# Patient Record
Sex: Female | Born: 1935 | Race: White | Hispanic: No
Health system: Southern US, Community
[De-identification: ages and names within clinical notes are randomized; demographics above are authoritative.]

---

## 2007-08-19 ENCOUNTER — Inpatient Hospital Stay: Payer: Self-pay | Admitting: Internal Medicine

## 2007-08-19 ENCOUNTER — Other Ambulatory Visit: Payer: Self-pay

## 2007-09-04 ENCOUNTER — Ambulatory Visit: Payer: Self-pay | Admitting: Gastroenterology

## 2008-12-01 ENCOUNTER — Emergency Department: Payer: Self-pay | Admitting: Emergency Medicine

## 2009-05-07 ENCOUNTER — Emergency Department: Payer: Self-pay | Admitting: Emergency Medicine

## 2010-01-11 ENCOUNTER — Emergency Department: Payer: Self-pay | Admitting: Emergency Medicine

## 2010-11-09 ENCOUNTER — Ambulatory Visit: Payer: Self-pay

## 2010-11-20 ENCOUNTER — Ambulatory Visit: Payer: Self-pay | Admitting: Emergency Medicine

## 2011-02-08 ENCOUNTER — Inpatient Hospital Stay: Payer: Self-pay | Admitting: Internal Medicine

## 2011-04-05 ENCOUNTER — Ambulatory Visit: Payer: Self-pay | Admitting: Nephrology

## 2011-04-25 ENCOUNTER — Emergency Department: Payer: Self-pay

## 2011-08-27 ENCOUNTER — Emergency Department: Payer: Self-pay | Admitting: Emergency Medicine

## 2012-02-07 ENCOUNTER — Ambulatory Visit: Payer: Self-pay | Admitting: Family Medicine

## 2013-01-09 ENCOUNTER — Other Ambulatory Visit: Payer: Self-pay

## 2013-01-12 ENCOUNTER — Ambulatory Visit: Payer: Self-pay | Admitting: Family Medicine

## 2013-05-19 ENCOUNTER — Emergency Department: Payer: Self-pay | Admitting: Emergency Medicine

## 2013-05-19 LAB — URINALYSIS, COMPLETE
Bilirubin,UR: NEGATIVE
Blood: NEGATIVE
Glucose,UR: NEGATIVE mg/dL (ref 0–75)
Ketone: NEGATIVE
Nitrite: NEGATIVE
Ph: 5 (ref 4.5–8.0)
Protein: NEGATIVE
Specific Gravity: 1.025 (ref 1.003–1.030)
Squamous Epithelial: 1
WBC UR: 1 /HPF (ref 0–5)

## 2013-05-19 LAB — DRUG SCREEN, URINE
Cannabinoid 50 Ng, Ur ~~LOC~~: NEGATIVE (ref ?–50)
Cocaine Metabolite,Ur ~~LOC~~: NEGATIVE (ref ?–300)
MDMA (Ecstasy)Ur Screen: NEGATIVE (ref ?–500)
Methadone, Ur Screen: NEGATIVE (ref ?–300)
Opiate, Ur Screen: NEGATIVE (ref ?–300)
Tricyclic, Ur Screen: NEGATIVE (ref ?–1000)

## 2013-05-19 LAB — COMPREHENSIVE METABOLIC PANEL
Albumin: 3 g/dL — ABNORMAL LOW (ref 3.4–5.0)
Alkaline Phosphatase: 124 U/L (ref 50–136)
Anion Gap: 5 — ABNORMAL LOW (ref 7–16)
BUN: 26 mg/dL — ABNORMAL HIGH (ref 7–18)
Calcium, Total: 8.8 mg/dL (ref 8.5–10.1)
Co2: 27 mmol/L (ref 21–32)
Creatinine: 0.84 mg/dL (ref 0.60–1.30)
EGFR (African American): >60
EGFR (Non-African Amer.): 60 — ABNORMAL LOW
SGPT (ALT): 17 U/L (ref 12–78)

## 2013-05-19 LAB — CBC
HGB: 10.5 g/dL — ABNORMAL LOW (ref 12.0–16.0)
MCH: 29.2 pg (ref 26.0–34.0)
Platelet: 212 10*3/uL (ref 150–440)
RBC: 3.6 10*6/uL — ABNORMAL LOW (ref 3.80–5.20)
RDW: 14.5 % (ref 11.5–14.5)
WBC: 7.8 10*3/uL (ref 3.6–11.0)

## 2013-05-19 LAB — ETHANOL
Ethanol %: <0.003 % (ref 0.000–0.080)
Ethanol: <3 mg/dL

## 2013-05-19 LAB — TSH: Thyroid Stimulating Horm: 3.33 u[IU]/mL

## 2013-05-23 ENCOUNTER — Other Ambulatory Visit: Payer: Self-pay | Admitting: Family Medicine

## 2013-05-23 LAB — URINALYSIS, COMPLETE
Bacteria: NONE SEEN
Bilirubin,UR: NEGATIVE
Blood: NEGATIVE
Glucose,UR: NEGATIVE mg/dL (ref 0–75)
Leukocyte Esterase: NEGATIVE
Nitrite: NEGATIVE
Protein: NEGATIVE
RBC,UR: 4 /HPF (ref 0–5)
Squamous Epithelial: 1
WBC UR: 1 /HPF (ref 0–5)

## 2013-06-05 ENCOUNTER — Other Ambulatory Visit: Payer: Self-pay | Admitting: Family Medicine

## 2013-08-18 ENCOUNTER — Inpatient Hospital Stay: Payer: Self-pay | Admitting: Internal Medicine

## 2013-08-18 LAB — COMPREHENSIVE METABOLIC PANEL
Albumin: 3.3 g/dL — ABNORMAL LOW (ref 3.4–5.0)
Alkaline Phosphatase: 106 U/L (ref 50–136)
BUN: 27 mg/dL — ABNORMAL HIGH (ref 7–18)
Bilirubin,Total: 0.4 mg/dL (ref 0.2–1.0)
Calcium, Total: 9.3 mg/dL (ref 8.5–10.1)
Co2: 24 mmol/L (ref 21–32)
Creatinine: 1.03 mg/dL (ref 0.60–1.30)
EGFR (African American): 54 — ABNORMAL LOW
EGFR (Non-African Amer.): 47 — ABNORMAL LOW
Glucose: 114 mg/dL — ABNORMAL HIGH (ref 65–99)
Osmolality: 287 (ref 275–301)
Potassium: 3.7 mmol/L (ref 3.5–5.1)
SGOT(AST): 23 U/L (ref 15–37)
Sodium: 141 mmol/L (ref 136–145)
Total Protein: 7.4 g/dL (ref 6.4–8.2)

## 2013-08-18 LAB — URINALYSIS, COMPLETE
Bilirubin,UR: NEGATIVE
Blood: NEGATIVE
Glucose,UR: NEGATIVE mg/dL (ref 0–75)
Ketone: NEGATIVE
Ph: 5 (ref 4.5–8.0)
RBC,UR: 3 /HPF (ref 0–5)
Specific Gravity: 1.03 (ref 1.003–1.030)
Squamous Epithelial: 1

## 2013-08-18 LAB — CBC
HCT: 35.9 % (ref 35.0–47.0)
MCH: 28.1 pg (ref 26.0–34.0)
MCHC: 33.2 g/dL (ref 32.0–36.0)
Platelet: 255 10*3/uL (ref 150–440)
WBC: 6.3 10*3/uL (ref 3.6–11.0)

## 2013-08-18 LAB — PRO B NATRIURETIC PEPTIDE: B-Type Natriuretic Peptide: 5979 pg/mL — ABNORMAL HIGH (ref 0–450)

## 2013-08-19 LAB — BASIC METABOLIC PANEL
Anion Gap: 6 — ABNORMAL LOW (ref 7–16)
Chloride: 110 mmol/L — ABNORMAL HIGH (ref 98–107)
EGFR (Non-African Amer.): 51 — ABNORMAL LOW
Osmolality: 284 (ref 275–301)
Sodium: 141 mmol/L (ref 136–145)

## 2013-08-19 LAB — CBC WITH DIFFERENTIAL/PLATELET
Eosinophil #: 0.1 10*3/uL (ref 0.0–0.7)
HCT: 32 % — ABNORMAL LOW (ref 35.0–47.0)
HGB: 11 g/dL — ABNORMAL LOW (ref 12.0–16.0)
Lymphocyte %: 20.5 %
MCH: 28.5 pg (ref 26.0–34.0)
MCHC: 34.3 g/dL (ref 32.0–36.0)
Monocyte #: 0.5 x10 3/mm (ref 0.2–0.9)
Monocyte %: 9.5 %
Neutrophil #: 3.8 10*3/uL (ref 1.4–6.5)
Neutrophil %: 66.8 %
RBC: 3.85 10*6/uL (ref 3.80–5.20)
WBC: 5.6 10*3/uL (ref 3.6–11.0)

## 2013-08-20 LAB — URINE CULTURE

## 2013-08-21 LAB — BASIC METABOLIC PANEL
BUN: 32 mg/dL — ABNORMAL HIGH (ref 7–18)
Calcium, Total: 9.2 mg/dL (ref 8.5–10.1)
Chloride: 107 mmol/L (ref 98–107)
Co2: 27 mmol/L (ref 21–32)
Creatinine: 1.54 mg/dL — ABNORMAL HIGH (ref 0.60–1.30)
EGFR (African American): 33 — ABNORMAL LOW
Osmolality: 288 (ref 275–301)
Potassium: 3.1 mmol/L — ABNORMAL LOW (ref 3.5–5.1)
Sodium: 141 mmol/L (ref 136–145)

## 2013-08-23 LAB — CULTURE, BLOOD (SINGLE)

## 2013-09-02 ENCOUNTER — Emergency Department: Payer: Self-pay | Admitting: Emergency Medicine

## 2013-09-02 LAB — URINALYSIS, COMPLETE
Bilirubin,UR: NEGATIVE
Blood: NEGATIVE
Hyaline Cast: 3
Ketone: NEGATIVE
Nitrite: NEGATIVE
Ph: 5 (ref 4.5–8.0)
RBC,UR: 6 /HPF (ref 0–5)
Squamous Epithelial: 1

## 2013-09-02 LAB — COMPREHENSIVE METABOLIC PANEL
Alkaline Phosphatase: 91 U/L (ref 50–136)
BUN: 25 mg/dL — ABNORMAL HIGH (ref 7–18)
Chloride: 108 mmol/L — ABNORMAL HIGH (ref 98–107)
Creatinine: 1.19 mg/dL (ref 0.60–1.30)
EGFR (African American): 46 — ABNORMAL LOW
Osmolality: 282 (ref 275–301)
Potassium: 3.8 mmol/L (ref 3.5–5.1)
SGPT (ALT): 22 U/L (ref 12–78)
Sodium: 139 mmol/L (ref 136–145)
Total Protein: 7 g/dL (ref 6.4–8.2)

## 2013-09-02 LAB — CK TOTAL AND CKMB (NOT AT ARMC): CK, Total: 189 U/L (ref 21–215)

## 2013-09-02 LAB — CBC
HCT: 35.9 % (ref 35.0–47.0)
MCHC: 33.4 g/dL (ref 32.0–36.0)
Platelet: 192 10*3/uL (ref 150–440)
RBC: 4.31 10*6/uL (ref 3.80–5.20)
RDW: 15.3 % — ABNORMAL HIGH (ref 11.5–14.5)

## 2013-10-29 ENCOUNTER — Ambulatory Visit: Payer: Self-pay | Admitting: Internal Medicine

## 2013-11-19 ENCOUNTER — Other Ambulatory Visit: Payer: Self-pay | Admitting: Family Medicine

## 2013-11-19 LAB — CBC WITH DIFFERENTIAL/PLATELET
BASOS ABS: 0 10*3/uL (ref 0.0–0.1)
Basophil %: 0.5 %
EOS ABS: 0 10*3/uL (ref 0.0–0.7)
EOS PCT: 0.1 %
HCT: 31.4 % — ABNORMAL LOW (ref 35.0–47.0)
HGB: 10.4 g/dL — AB (ref 12.0–16.0)
LYMPHS PCT: 9.1 %
Lymphocyte #: 0.7 10*3/uL — ABNORMAL LOW (ref 1.0–3.6)
MCH: 29.4 pg (ref 26.0–34.0)
MCHC: 33.1 g/dL (ref 32.0–36.0)
MCV: 89 fL (ref 80–100)
MONOS PCT: 10.1 %
Monocyte #: 0.8 x10 3/mm (ref 0.2–0.9)
NEUTROS PCT: 80.2 %
Neutrophil #: 6.2 10*3/uL (ref 1.4–6.5)
Platelet: 180 10*3/uL (ref 150–440)
RBC: 3.53 10*6/uL — AB (ref 3.80–5.20)
RDW: 20 % — AB (ref 11.5–14.5)
WBC: 7.7 10*3/uL (ref 3.6–11.0)

## 2013-11-19 LAB — BASIC METABOLIC PANEL
ANION GAP: 9 (ref 7–16)
BUN: 43 mg/dL — ABNORMAL HIGH (ref 7–18)
Calcium, Total: 9 mg/dL (ref 8.5–10.1)
Chloride: 110 mmol/L — ABNORMAL HIGH (ref 98–107)
Co2: 24 mmol/L (ref 21–32)
Creatinine: 1.25 mg/dL (ref 0.60–1.30)
EGFR (African American): 43 — ABNORMAL LOW
EGFR (Non-African Amer.): 37 — ABNORMAL LOW
GLUCOSE: 107 mg/dL — AB (ref 65–99)
OSMOLALITY: 296 (ref 275–301)
POTASSIUM: 5.3 mmol/L — AB (ref 3.5–5.1)
SODIUM: 143 mmol/L (ref 136–145)

## 2013-11-23 ENCOUNTER — Emergency Department: Payer: Self-pay | Admitting: Emergency Medicine

## 2013-11-23 LAB — CBC WITH DIFFERENTIAL/PLATELET
BASOS ABS: 0 10*3/uL (ref 0.0–0.1)
Basophil %: 0.6 %
EOS PCT: 0.1 %
Eosinophil #: 0 10*3/uL (ref 0.0–0.7)
HCT: 33.4 % — ABNORMAL LOW (ref 35.0–47.0)
HGB: 10.6 g/dL — ABNORMAL LOW (ref 12.0–16.0)
Lymphocyte #: 0.5 10*3/uL — ABNORMAL LOW (ref 1.0–3.6)
Lymphocyte %: 7.4 %
MCH: 28.9 pg (ref 26.0–34.0)
MCHC: 31.9 g/dL — ABNORMAL LOW (ref 32.0–36.0)
MCV: 91 fL (ref 80–100)
MONO ABS: 0.3 x10 3/mm (ref 0.2–0.9)
Monocyte %: 4.3 %
NEUTROS ABS: 6 10*3/uL (ref 1.4–6.5)
Neutrophil %: 87.6 %
Platelet: 222 10*3/uL (ref 150–440)
RBC: 3.68 10*6/uL — AB (ref 3.80–5.20)
RDW: 18.8 % — AB (ref 11.5–14.5)
WBC: 6.9 10*3/uL (ref 3.6–11.0)

## 2013-11-23 LAB — COMPREHENSIVE METABOLIC PANEL
ALK PHOS: 63 U/L
ANION GAP: 5 — AB (ref 7–16)
Albumin: 2.9 g/dL — ABNORMAL LOW (ref 3.4–5.0)
BUN: 61 mg/dL — AB (ref 7–18)
Bilirubin,Total: 0.5 mg/dL (ref 0.2–1.0)
CO2: 25 mmol/L (ref 21–32)
Calcium, Total: 8.9 mg/dL (ref 8.5–10.1)
Chloride: 112 mmol/L — ABNORMAL HIGH (ref 98–107)
Creatinine: 1.51 mg/dL — ABNORMAL HIGH (ref 0.60–1.30)
EGFR (African American): 34 — ABNORMAL LOW
EGFR (Non-African Amer.): 29 — ABNORMAL LOW
Glucose: 126 mg/dL — ABNORMAL HIGH (ref 65–99)
OSMOLALITY: 302 (ref 275–301)
POTASSIUM: 4.4 mmol/L (ref 3.5–5.1)
SGOT(AST): 34 U/L (ref 15–37)
SGPT (ALT): 20 U/L (ref 12–78)
SODIUM: 142 mmol/L (ref 136–145)
TOTAL PROTEIN: 7.4 g/dL (ref 6.4–8.2)

## 2013-11-23 LAB — PROTIME-INR
INR: 1
Prothrombin Time: 13.4 secs (ref 11.5–14.7)

## 2013-11-23 LAB — TROPONIN I: Troponin-I: 1.8 ng/mL — ABNORMAL HIGH

## 2013-11-23 LAB — PRO B NATRIURETIC PEPTIDE: B-TYPE NATIURETIC PEPTID: 54436 pg/mL — AB (ref 0–450)

## 2013-11-23 LAB — MAGNESIUM: Magnesium: 2.3 mg/dL

## 2013-11-23 LAB — PHOSPHORUS: Phosphorus: 4.3 mg/dL (ref 2.5–4.9)

## 2013-11-23 LAB — CK-MB: CK-MB: 2.9 ng/mL

## 2013-11-28 LAB — CULTURE, BLOOD (SINGLE)

## 2013-11-29 ENCOUNTER — Ambulatory Visit: Payer: Self-pay | Admitting: Internal Medicine

## 2013-11-29 DEATH — deceased

## 2014-06-18 IMAGING — CR RIGHT HIP - COMPLETE 2+ VIEW
1 series · 3 of 3 positions shown · non-contrast
Comparison: CT pelvis August 18, 2013 ; right hip radiographs
August 18, 2013

CLINICAL DATA: Pain post trauma

EXAM:
RIGHT HIP - COMPLETE 2+ VIEW

[Series 8: t pelvis ap · 0.14mm/px · 3 of 3 slices shown]
[im 1/3]
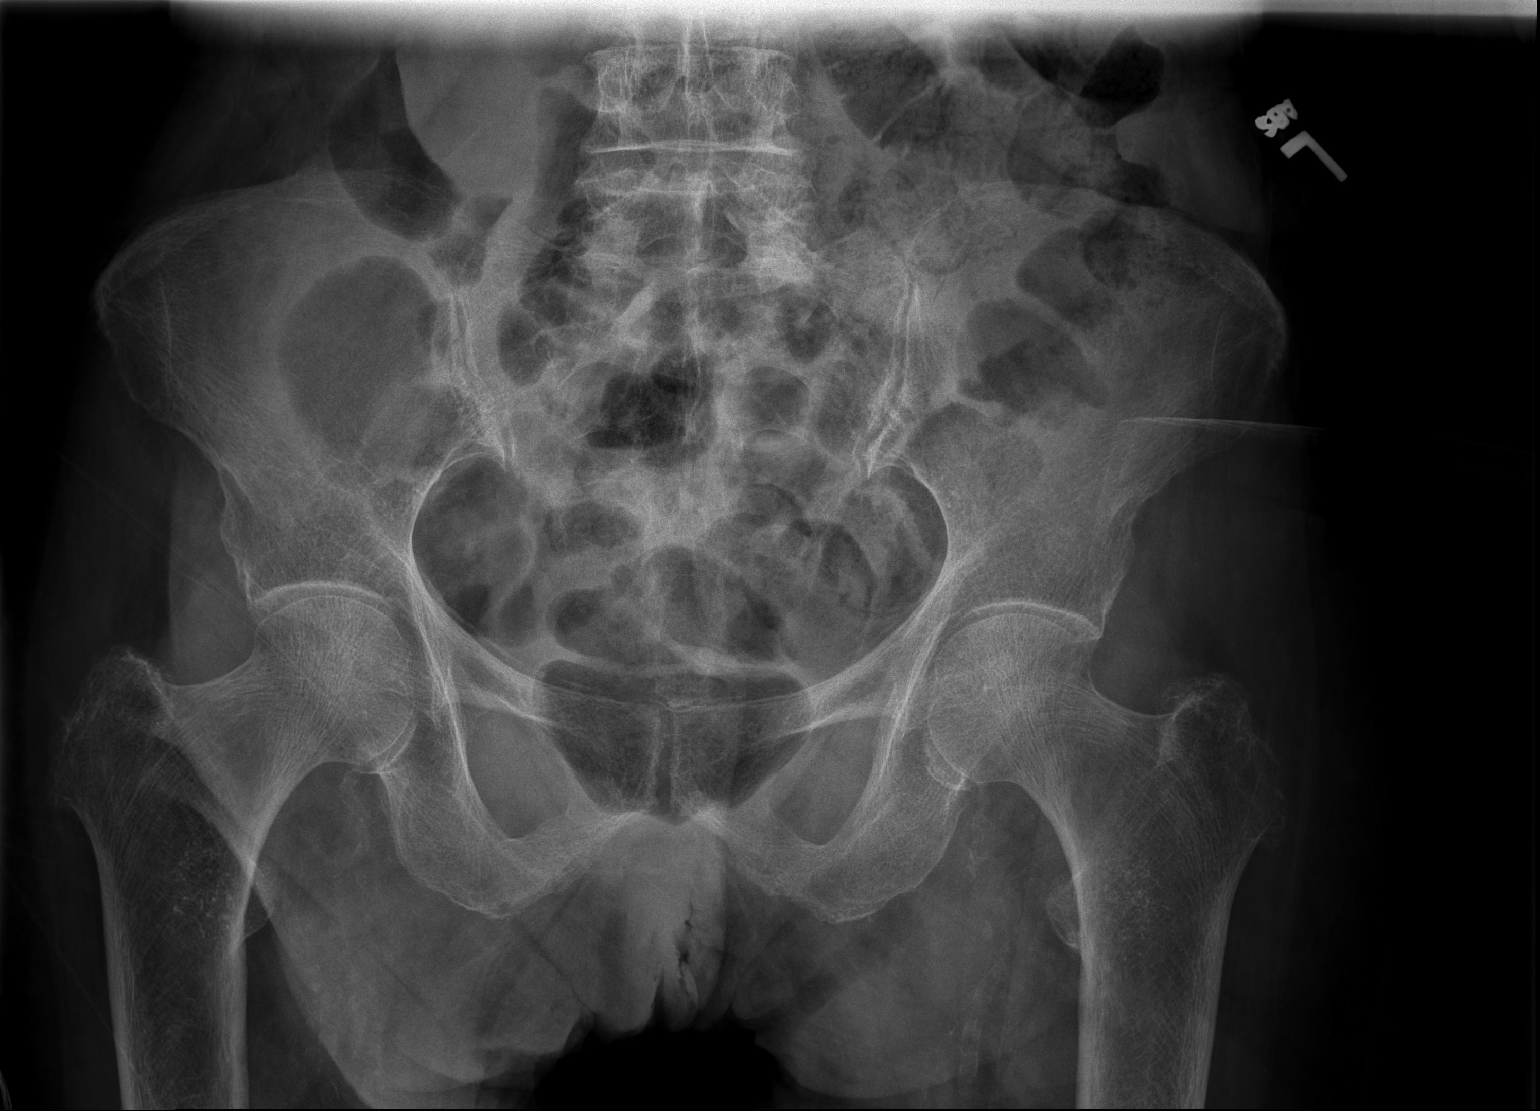
[im 2/3]
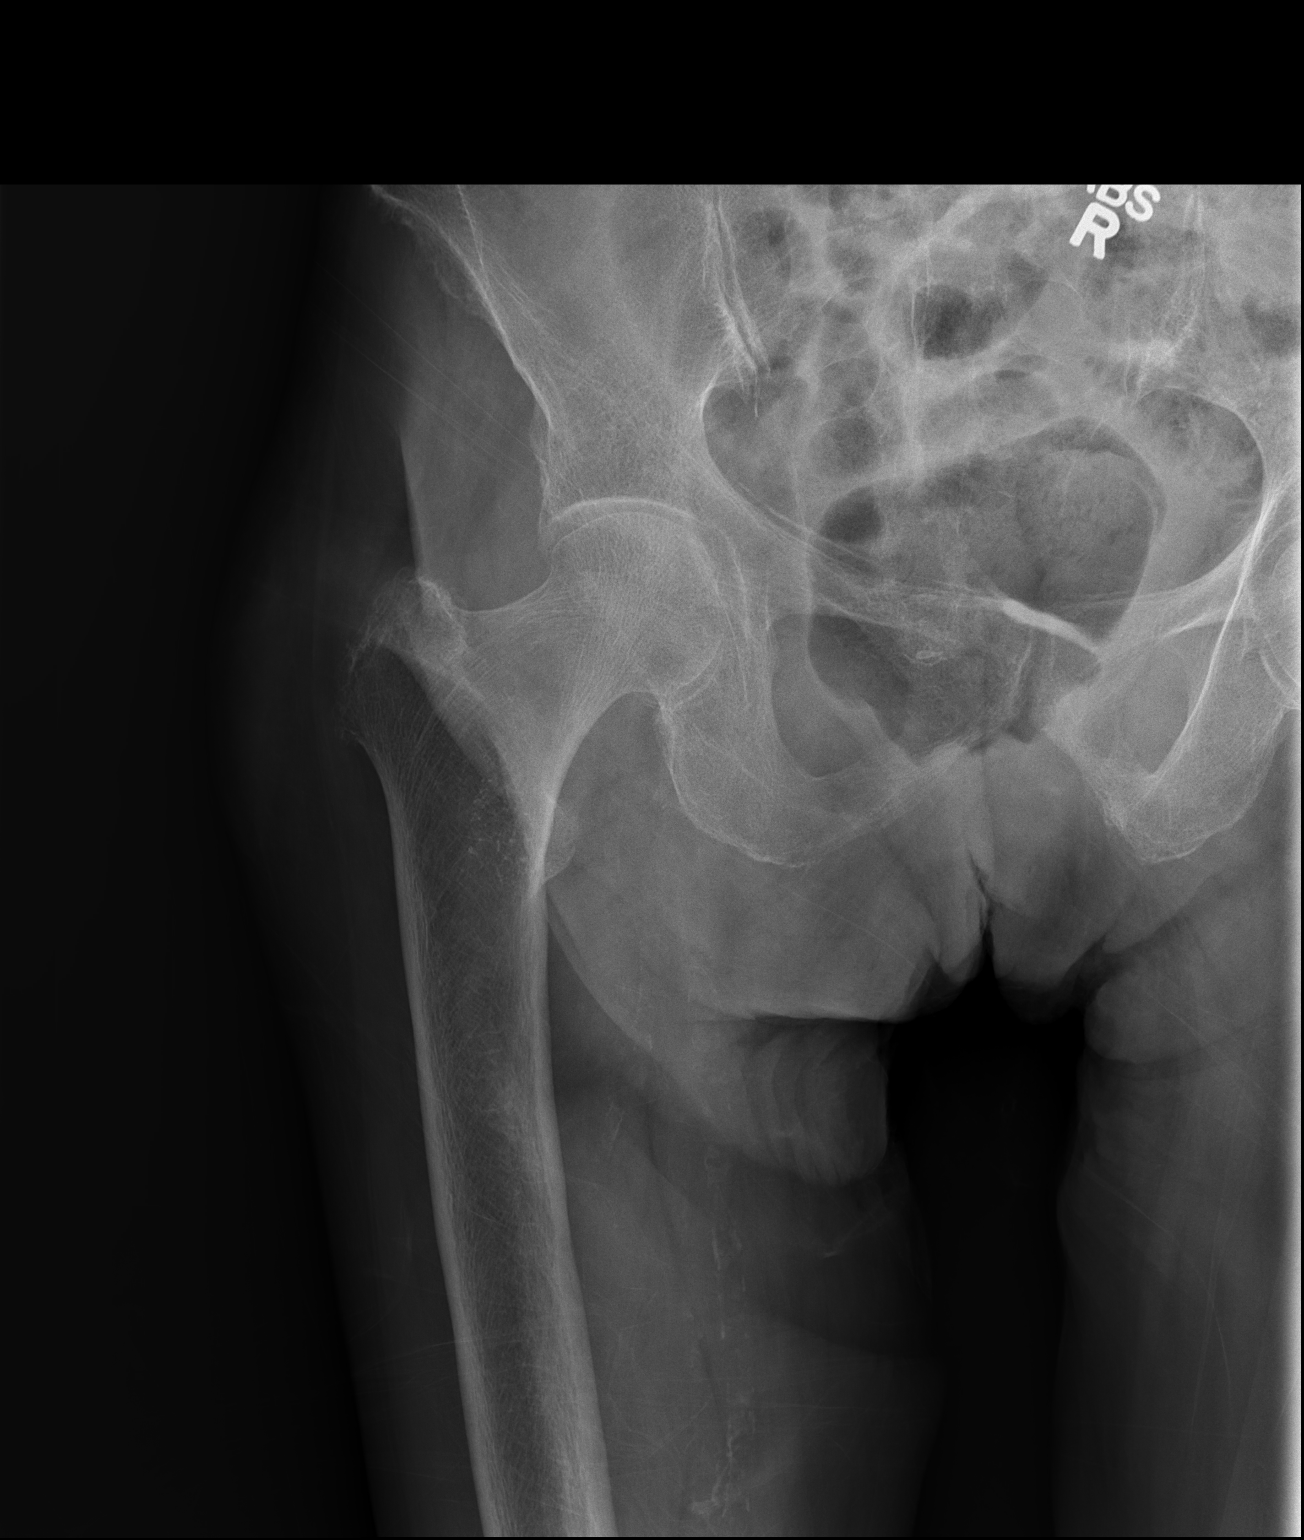
[im 3/3]
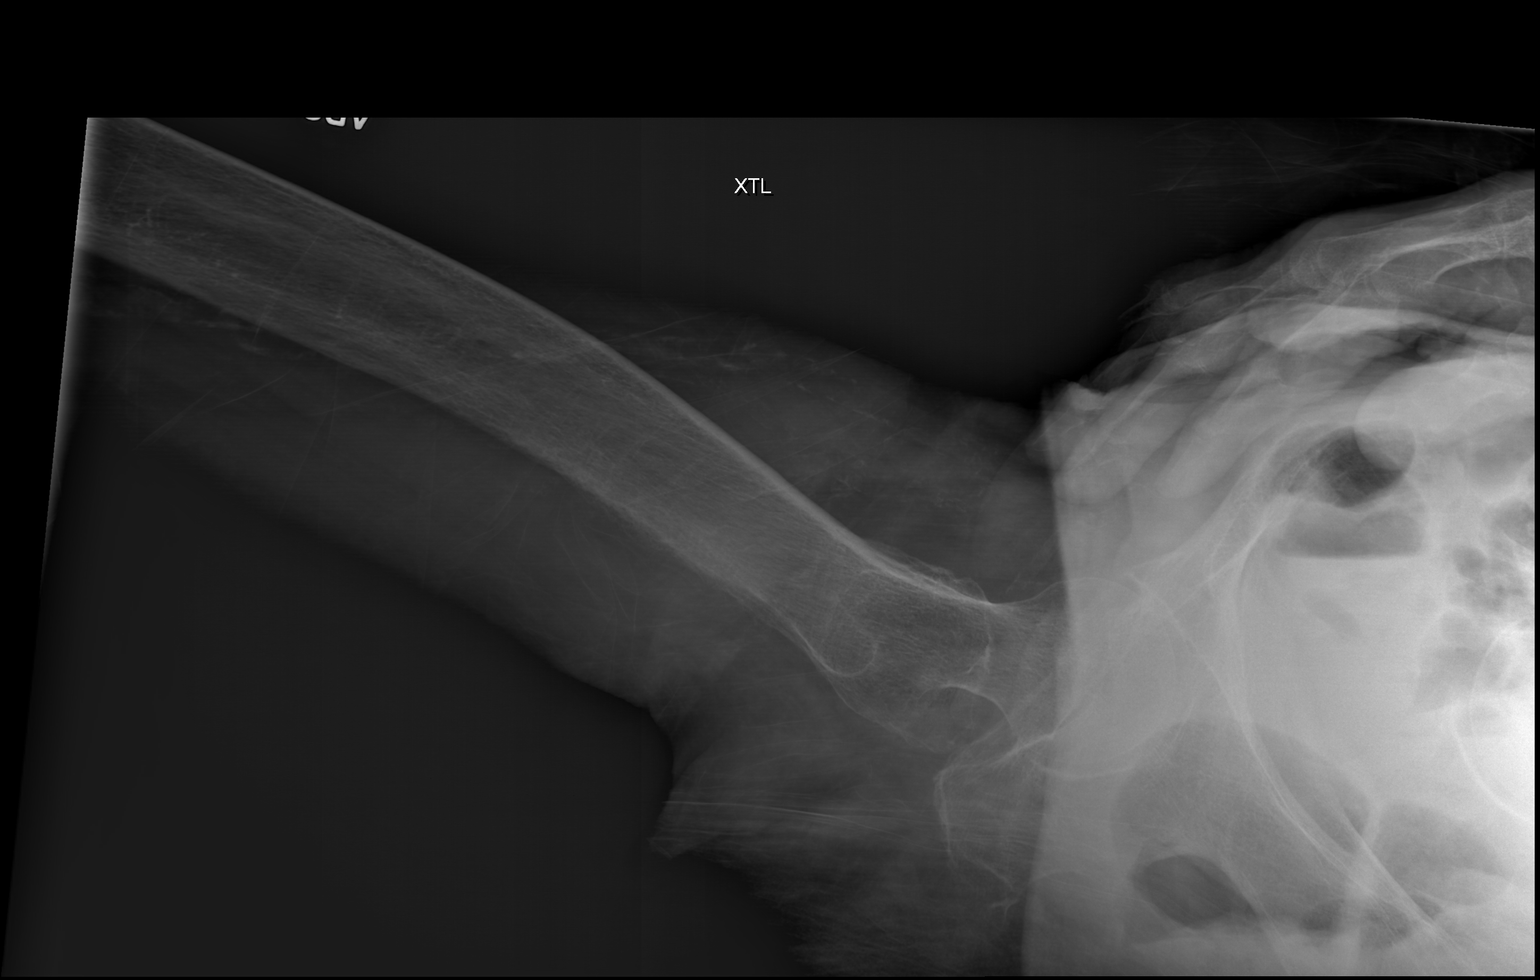

[3 of 3 positions shown; findings below may reference images not displayed]

FINDINGS: Frontal pelvis as well as frontal and lateral right hip images were
obtained. There is no fracture or dislocation apparent. There is
mild symmetric narrowing of both hip joints. No erosive change.
Bones are osteoporotic.
IMPRESSION: Mild symmetric narrowing of both hip joints. No demonstrable
fracture or dislocation. Bones osteoporotic.

## 2014-06-18 IMAGING — CT CT HEAD WITHOUT CONTRAST
3 series · 17 of 30 positions shown, 18 images · non-contrast
Comparison: CT 05/19/2013.

CLINICAL DATA: Recent fall with head injury. Decreased level of
consciousness.

EXAM:
CT HEAD WITHOUT CONTRAST
TECHNIQUE: Contiguous axial images were obtained from the base of the skull
through the vertex without intravenous contrast.

[Series 5: head wo · axial · 0.45mm/px · z∈[-92,-7]mm · 4 of 29 slices shown, 5 images]
[im 6/29  brain]
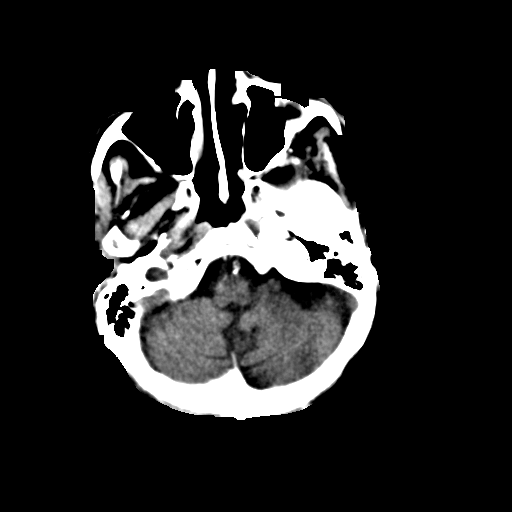
[im 6/29  bone]
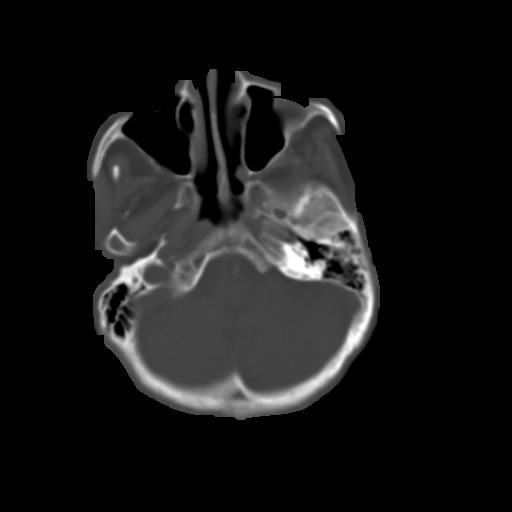
[im 12/29  brain]
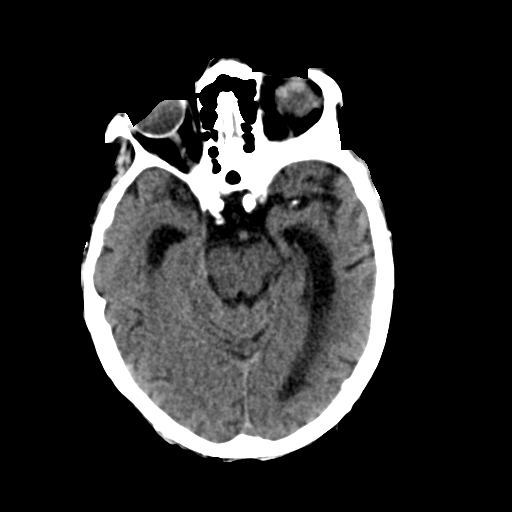
[im 17/29  brain]
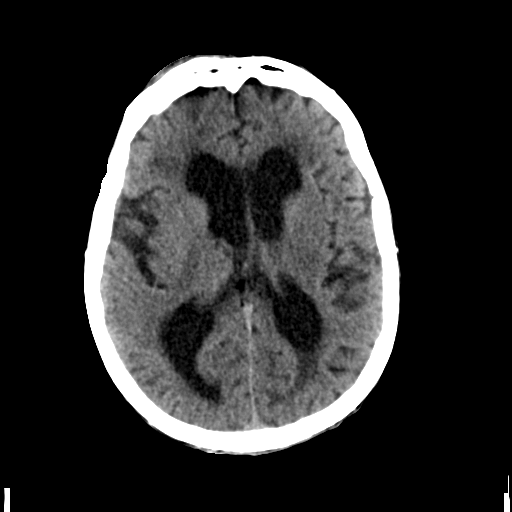
[im 23/29  brain]
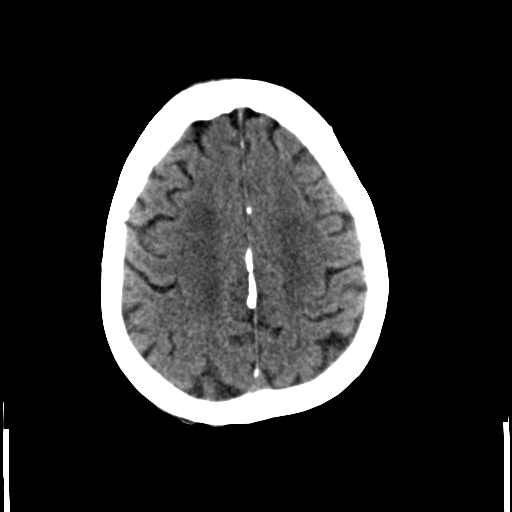

[Series 6: head bone · axial · 0.45mm/px · z∈[-109,+17]mm · 8 of 73 slices shown]
[im 5/73  bone]
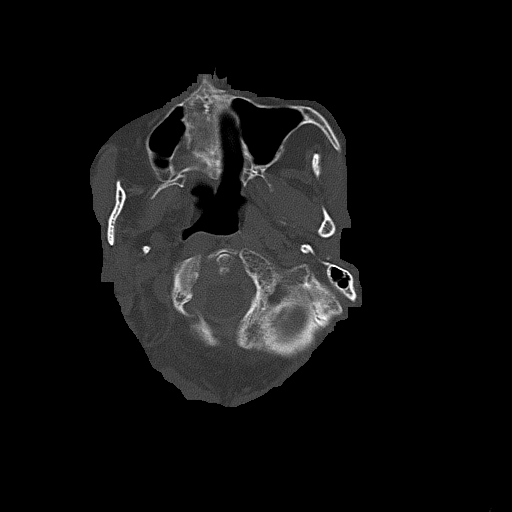
[im 14/73  bone]
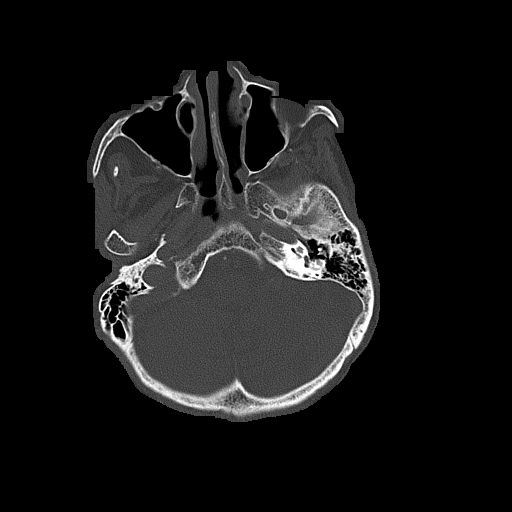
[im 23/73  bone]
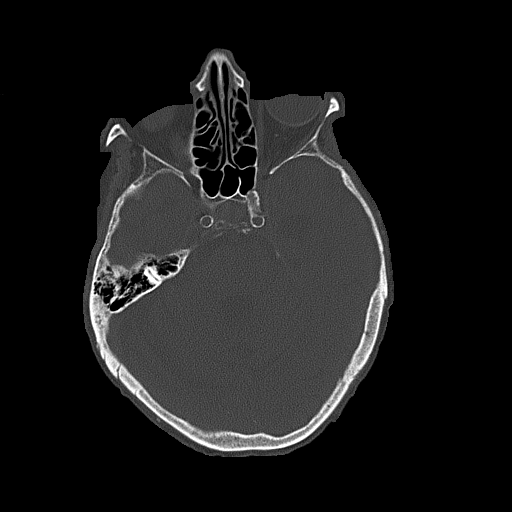
[im 32/73  bone]
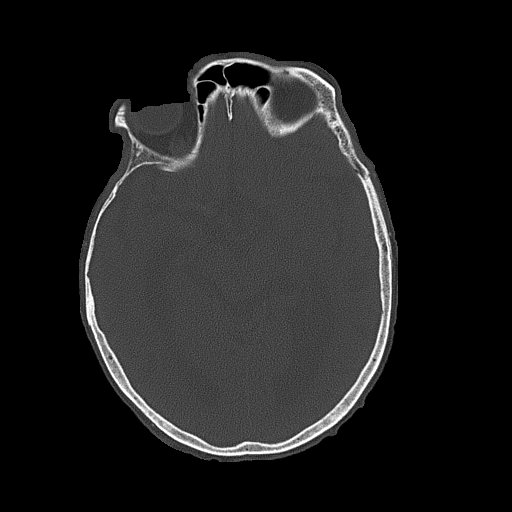
[im 41/73  bone]
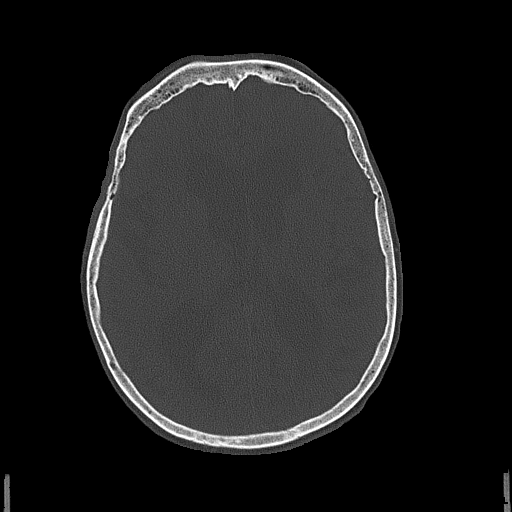
[im 50/73  bone]
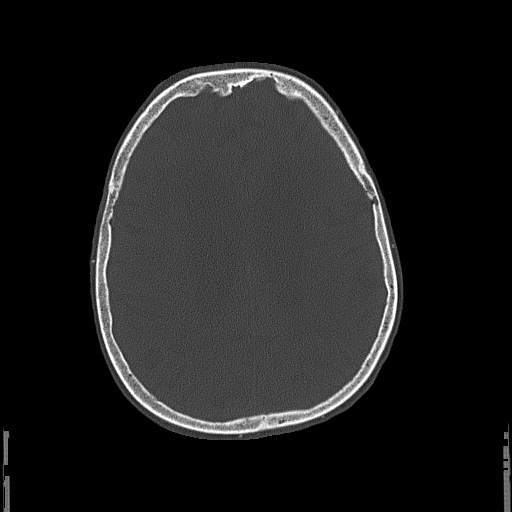
[im 59/73  bone]
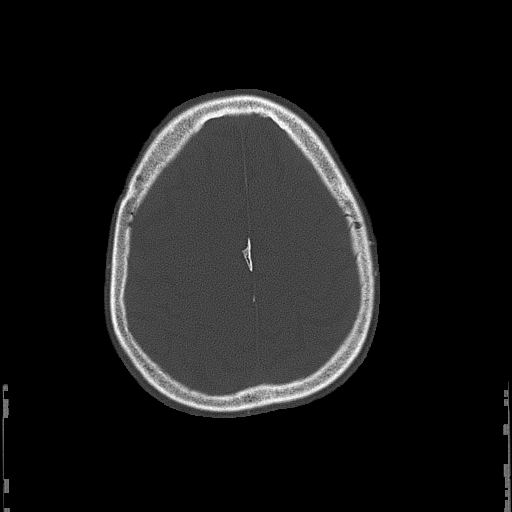
[im 68/73  bone]
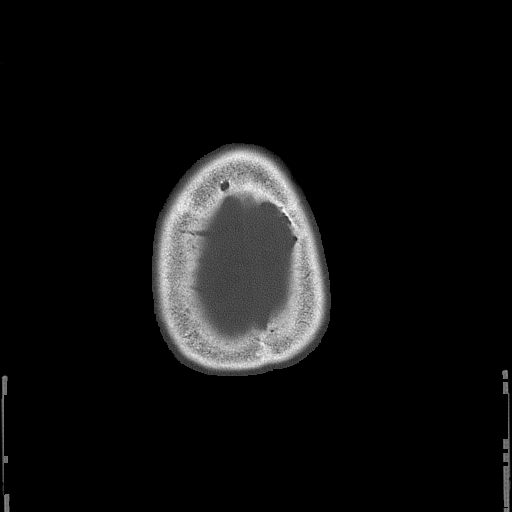

[Series 7: head wo2 · axial · 0.45mm/px · z∈[-86,+9]mm · 5 of 29 slices shown]
[im 5/29  brain]
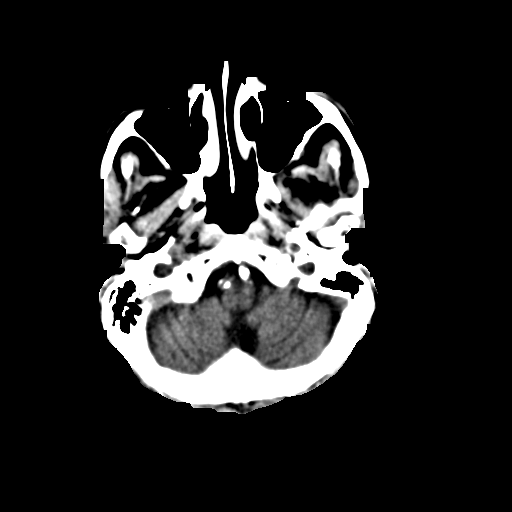
[im 10/29  brain]
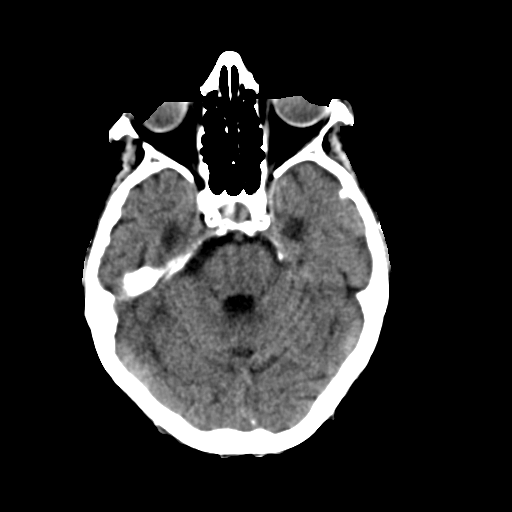
[im 15/29  brain]
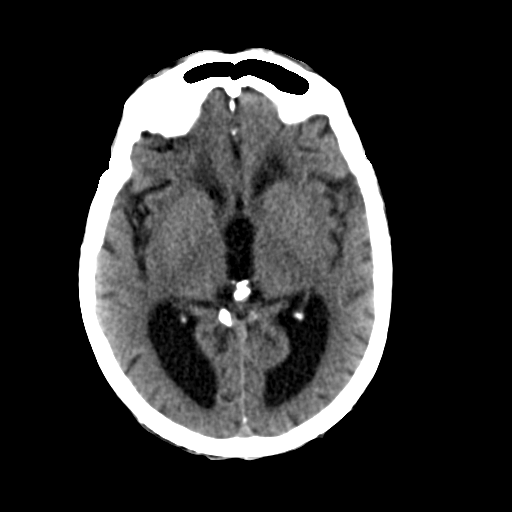
[im 19/29  brain]
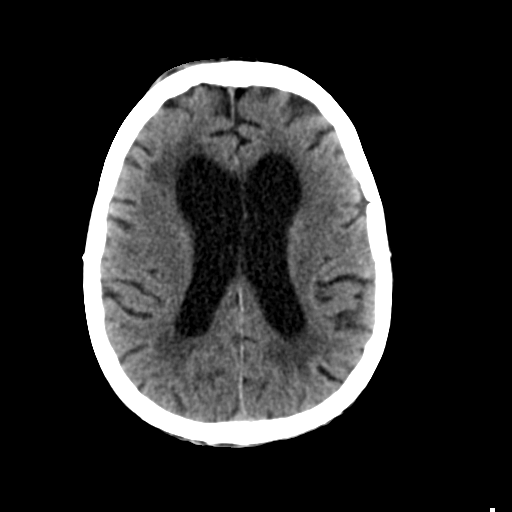
[im 24/29  brain]
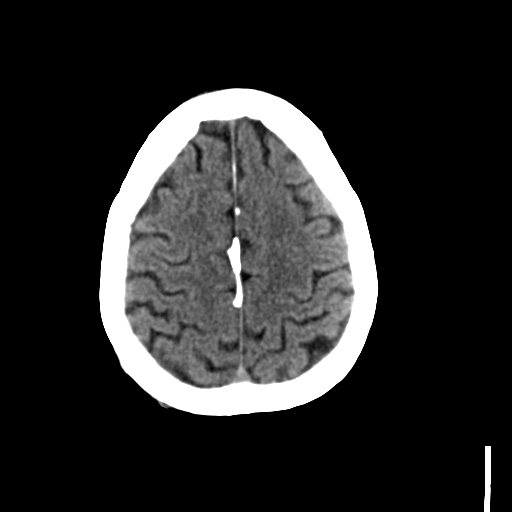

[17 of 30 positions shown; findings below may reference images not displayed]

FINDINGS: Moderate atrophy. Ventricular enlargement is stable. Chronic
microvascular ischemic changes in the white matter are stable.
Negative for acute infarct.

Negative for hemorrhage or mass lesion. No midline shift. Negative
for skull fracture.
IMPRESSION: Chronic atrophy and microvascular ischemia. No acute abnormality.

## 2015-02-18 NOTE — Discharge Summary (Signed)
PATIENT NAME:  Shannon Franklin, Vadis A MR#:  [id number] DATE OF BIRTH:  03/22/1918  DATE OF ADMISSION:  08/18/2013 DATE OF DISCHARGE:  08/21/2013   ADMITTING PHYSICIAN: Enid Baasadhika Miguel Medal, MD  PRIMARY CARE PHYSICIAN: Ananias Pilgrimarol Smith, [facility], [facility]  DISCHARGING PHYSICIAN: Enid Baasadhika Kabrea Seeney, MD  CONSULTATIONS IN THE HOSPITAL: None.   DISCHARGE DIAGNOSES:  1. Acute systolic congestive heart failure exacerbation, ejection fraction of 25%, which is worse since her last echocardiogram in 2012, when ejection fraction was 50%. 2. Extended-spectrum beta-lactamase Escherichia coli urinary tract infection.  3. Dementia.  4. Coronary artery disease.  5. Severe aortic stenosis.  6. Seizure disorder.  7. Hypothyroidism. 8. Vertigo. 9. Low-normal blood pressure secondary to cardiomyopathy and poor ejection fraction.  DISCHARGE HOME MEDICATIONS:  1. Synthroid 100 mcg p.o. daily.  2. Zoloft 50 mg p.o. daily.  3. Zyrtec 10 mg p.o. daily.  4. Vitamin D 3000 international units p.o. daily.  5. Vitamin B12 1000 mcg p.o. daily.  6. Keppra 250 mg p.o. b.i.d.  7. Pepcid 20 mg p.o. twice a week on Monday and Friday.  8. Flonase 50 mcg inhalation 2 sprays once a day.  9. Tylenol Extra Strength 1000 mg every 8 hours.  10. Senna/Colace 50/8.6 mg 2 tablets twice a day as needed for constipation.  11. Ativan 2 mg/mL injectable solution 1 mL injectable once as needed for tonic-clonic seizure, can repeat it every 5 minutes for up to 4 doses total.  12. Preparation H 0.25% rectal suppository 1 suppository every 8 hours as needed for hemorrhoids and pain.  13. Multivitamin 1 tablet p.o. daily.  14. Lisinopril 2.5 mg p.o. daily.  15. Risperidone 0.25 mg p.o. q.8 hours p.r.n. for agitation.  16. Ertapenem 500 mg IV every 24 hours until 08/31/2013.  17. Lasix 20 mg p.o. daily.  18. Tramadol 50 mg q.8 hours p.r.n. for pain.   DISCHARGE DIET: Low-sodium diet.   DISCHARGE ACTIVITY: As tolerated.    FOLLOWUP  INSTRUCTIONS:  1. PCP followup in 1 to 2 weeks.  2. Basic metabolic panel in 1 week.  3. PICC line care.   LABORATORY AND IMAGING STUDIES:  Sodium 141, potassium 3.1, chloride 107, bicarbonate 27, BUN 32, creatinine 1.5, glucose 101 and calcium of 9.2.  WBC 5.6, hemoglobin 11.0, hematocrit 32.0, platelet count 223.  Chest x-ray on 08/19/2013 showing improvement in appearance of pulmonary interstitium, suggesting resolving pulmonary edema. Remaining bilateral pleural effusions and mild interstitial edema are present. Bilateral pleural effusions, left greater than right are present. Echo Doppler showing LV ejection fraction is 25% to 30%, severely decreased global left ventricular systolic function and large pleural effusion in the left lateral region. Mild aortic regurgitation, severe aortic stenosis and moderate to severe mitral valve regurgitation present. Blood cultures on admission are negative.  Right hip x-ray showing no fracture, may be a right pubic fracture.  Pelvic x-ray showing cannot exclude a right pubic fracture. Urinalysis showed infection with 2+ leukocyte esterase and nitrate positive and 31 WBCs and 3+ bacteria.  Urine cultures growing greater than 100,000 colonies of ESBL E. coli.   BRIEF HOSPITAL COURSE: Shannon Franklin is a 81 year old elderly Caucasian female with past medical history significant for dementia, who is wheelchair-bound at baseline, [facility] to complaining of right hip pain. The patient also has a history of coronary artery disease, severe aortic stenosis, dementia, hypothyroidism and seizure disorder.   1. Right hip pain on admission. The patient has a soft tissue firm mass  on the lateral side of right hip, which I think is chronic, nonerythematous, mobile, but she did not have any fracture on the x-rays. She had a mild right pubic fracture which could subacute versus chronic. It does not appear to be acute. Her pain was  controlled with Tylenol while in the hospital most of the time, and she has not complained of much pain, so no further workup was done for the right hip. She was able to tolerate the transfers without grimacing in pain.  2. Acute congestive heart failure, systolic dysfunction. The patient's last echocardiogram was in 2012, which showed EF of 50% and diastolic dysfunction. The patient appeared to be more dyspneic on exam. She had a little bit of pedal edema and crackles on exam, so chest x-ray was done, which showed pulmonary edema and large pleural effusions. She was started on IV Lasix and diuresed well. Repeat echo showed drop in ejection fraction to 25%. She did not have any wall motion abnormalities. She was started on extremely low-dose lisinopril, and Lasix has been cut down to 20 mg daily at this time. Potassium was replaced prior to discharge, and she will have a repeat basic metabolic panel in 1 week. Her renal function started to get worse a little bit, so Lasix dose was decreased. Chest x-ray showed improvement. Still has bilateral pleural effusions, left greater than the right. She will need outpatient followup. Blood pressure has been low-normal, likely from her age and also from her cardiomyopathy, so her medication doses were adjusted and is being sent on minimal-dose medications.  3. Extended spectrum beta-lactamase Escherichia coli urinary tract infection. The patient was initially started on Levaquin for her urinary tract infection; however, cultures came back finally on 08/20/2013, and that is when her Pincus Sanes was started. The patient has a PICC line now and will continue for a total 10-day treatment.  4. Seizure disorder. EEG showed positive for seizure waves, and she has been on Keppra b.i.d. 5. Hypothyroidism. Continue Synthroid.  6. Dementia. The patient does have history of dementia and is confused at baseline with periods of agitation. She is being discharged on risperidone p.r.n. at the  time of discharge.   DISCHARGE CONDITION: Stable with guarded long-term prognosis.   DISCHARGE DISPOSITION: To [facility] skilled nursing facility.   TIME SPENT ON DISCHARGE: 45 minutes.   CODE STATUS: Do not resuscitate.    ____________________________ Enid Baas, MD rk:lb D: 08/21/2013 10:46:20 ET T: 08/21/2013 11:13:48 ET JOB#: [id number]  cc: Enid Baas, MD, <Dictator> Enid Baas MD ELECTRONICALLY SIGNED 09/08/2013 13:46

## 2015-02-18 NOTE — H&P (Signed)
PATIENT NAME:  Shannon Franklin, Shannon Franklin MR#:  [id number] DATE OF BIRTH:  [birth year]  ADMITTING PHYSICIAN: Gladstone Lighter, M.D.   PRIMARY CARE PHYSICIAN: Dr. Devoria Albe at [facility].   CHIEF COMPLAINT: Brought in for hip pain and wheezing.   HISTORY OF PRESENT ILLNESS: Shannon Franklin is a 81 year old elderly Caucasian female with past medical history significant for dementia, severe aortic stenosis, coronary artery disease, hypothyroidism, hyperlipidemia, and anxiety, who is a resident at [facility] and was brought in secondary to acute right hip pain without any fall noticed by a nurse this morning. The patient is essentially wheelchair-bound at baseline and a new nurse was taking care of her this morning and during transferring her from bed to chair, she noticed that the patient was grimacing immensely on touching her right hip. There is a small firm swelling on the lateral side of the right hip region which does not appear to be acute, and also the patient was having minimal difficulty in breathing with some audible wheezing, so was sent over to the ER.   X-rays of the hip did not reveal any acute fracture of the hip, but there was a questionable right pubic rami fracture which was confirmed on the CT of the pelvis, and it looked more like a subacute fracture.   Also chest x-ray revealed increased interstitial opacities, likely CHF, so the patient is being admitted for the same.   The patient has dementia and is a very poor historian. She is oriented x1 to 2 at baseline. She is pleasant and conversational at this time. She was more lethargic when she initially came in according to the nurse.   PAST MEDICAL HISTORY:  1.  Coronary artery disease.  2.  Severe aortic stenosis.  3.  Hypothyroidism.  4.  Hyperlipidemia.  5.  Dementia.  6.  Anxiety.  7.  Gastroesophageal reflux disease.  8.  Vertigo. 9.  Seizure disorder with positive EEG.   PAST SURGICAL HISTORY:  1.  Hysterectomy.   2.  Appendectomy.  3.  Carpal tunnel syndrome.  4.  Cataract surgery.   ALLERGIES:  1.  DETROL.  2.  LIPITOR.  3.  MACROBID.  4.  PENICILLIN.  5.  SULFA DRUGS.   CURRENT MEDICATIONS:  1.  Ativan 2 mg per mL injectable solution - 1 mL injectable once as needed for tonic-clonic seizures.  2.  Flonase 2 sprays nasal once a day.  3.  Keppra 250 mg p.o. b.i.d.  4.  Multivitamin 1 tablet p.o. daily.  5.  Pepcid 20 mg p.o. b.i.d. on Mondays and Fridays.  6.  Preparation H rectal suppository as needed for hemorrhoids.  7.  Senna Colace 2 tablets orally twice a day as needed for constipation.  8.  Synthroid 100 mcg p.o. daily.  9.  Tylenol Extra Strength 1000 mg every 8 hours as needed for pain.  10.  Vitamin B12 1000 mcg p.o. daily.  11.  Vitamin D3 1000 international units p.o. daily.  12.  Zoloft 50 mg p.o. daily.  13.  Zyrtec 10 mg p.o. daily.   SOCIAL HISTORY: Has been a resident of [facility]. Is wheelchair-bound at baseline. No history of any smoking or alcohol use.   FAMILY HISTORY: Not significant considering her age.   REVIEW OF SYSTEMS: Difficult to be obtained secondary to her dementia at this time.   PHYSICAL EXAMINATION:  VITAL SIGNS: Temperature 98.3 degrees Fahrenheit, pulse 87, respirations 18, blood pressure 139/76, pulse oximetry 95% on  room air.  GENERAL: Well-built, ill nourished female lying in bed, not in any acute distress.  HEENT: Normocephalic, atraumatic. Pupils are equal, postsurgical, reacting to light. Anicteric sclerae. Extraocular movements intact.  OROPHARYNX: Clear without erythema, mass or exudates. Nasopharynx without any lesions or discharges.  NECK: Supple. Normal range of motion without pain. No JVD. Negative for carotid bruits on both sides. No lymphadenopathy.  LUNGS: Moving air bilaterally. Fine crackles bibasilar with decreased breath sounds. No use of accessory muscles for breathing. There is an audible wheezing, but sounds more from the  upper airways. Lungs do not exhibit any wheezing or rhonchi.  CARDIOVASCULAR: S1, S2, regular rate and rhythm. She has a loud 3/6 systolic murmur in the aortic area and also in the mitral area radiating to the carotids bilaterally. No rubs or gallops.  ABDOMEN: Soft, nontender, nondistended. No hepatosplenomegaly. Normal bowel sounds.  EXTREMITIES: With 1+ pedal edema with mild erythema and 2+ dorsalis pedis pulses palpable bilaterally, no clubbing or cyanosis. She has a firm 5 x 2 cm mobile mass on the lateral part of upward thigh which is tender but nonerythematous. No hematoma. No test. No pubic pain on exam.  SKIN: No acne, rash or lesions.  LYMPHATICS: No cervical or inguinal lymphadenopathy.  NEUROLOGIC: Cranial nerves seem to be intact. No focal deficits noted. Sensation is intact. Upper extremity strength is 4/5 both arms and lower extremity strength is 2 to 3 over 5 which is chronic.  PSYCHIATRIC: Pleasant and conversational but oriented x1.   LABORATORY DATA: WBC 6.3, hemoglobin 11.9, hematocrit 35.9, platelet count 255.  Sodium 141, potassium 3.7, chloride 111, bicarb 24, BUN 27, creatinine 1.03, glucose 114, and calcium of 9.3.   ALT 21, AST 23, alk phos 106, total bilirubin 0.4 and albumin 3.3. Urinalysis with leukocyte esterase, nitrate positive, 31 WBCs and 3+ bacteria. BNP was elevated at 5979. Troponin less than 0.02.   Chest x-ray with diffuse pneumonitis versus CHF changes which worsened since the last x-ray.   Pelvic x-ray shows right pubic fracture at the superior ramus near the pubic bone.   Right hip x-ray showing no evidence of hip fracture.   CT of the pelvis is done, showing no evidence of acute pubic bone fracture or other pelvic fracture. Some lucency in parasymphyseal portion of right pubic bone which could represent a nondisplaced fracture correlate with the symptoms.   EKG showing normal sinus rhythm.  No acute ST-T wave abnormalities.   ASSESSMENT AND PLAN:  This is a 81 year old female with past medical history significant for dementia, seizure disorder, aortic stenosis, coronary artery disease, anxiety, who is wheelchair-bound at baseline who was brought in secondary to congestive heart failure and also right hip pain.  1.  Acute congestive heart failure based on chest x-ray, not hypoxic. Will start Lasix at 20 mg IV b.i.d. and follow-up chest x-ray in the a.m. Likely has diastolic dysfunction. Last echocardiogram in 2012 with EF of 50% and does have wall-motion abnormalities and aortic stenosis. Followup repeat echo. Wean off oxygen. Her saturations are normal at room air at this time.  2.  Right hip pain. No fall. Wheelchair-bound at baseline. Found tender mass on lateral part of right hip, appears chronic, could be lipoma, not mentioned on CT. There is a subacute right pubic ramus fracture likely. Pain management for now.  3.  Urinary tract infection. Will send for urine cultures and blood cultures and start her on Levaquin.  4.  Seizure disorder. She is on Keppra.  5.  Hypothyroidism, on Synthroid.  6.  Deep vein thrombosis prophylaxis with Lovenox.   CODE STATUS: DO NOT RESUSCITATE.   TIME SPENT ON ADMISSION: 50 minutes.   ____________________________ Gladstone Lighter, MD rk:np D: 08/18/2013 17:59:19 ET T: 08/18/2013 18:28:51 ET JOB#: [id number]  cc: Gladstone Lighter, MD, <Dictator> Devoria Albe, MD Gladstone Lighter MD ELECTRONICALLY SIGNED 08/19/2013 14:23

## 2015-02-18 NOTE — Op Note (Signed)
PATIENT NAME:  Shannon Franklin, Stayce A MR#:  [id number] DATE OF BIRTH:  September 17, 1918  DATE OF PROCEDURE:  08/20/2013  PREOPERATIVE DIAGNOSES:  1.  Extended broad-spectrum beta-lactamase urinary tract infection.  2.  Congestive heart failure.  3.  Seizure disorder.  4.  Dementia.   POSTOPERATIVE DIAGNOSES:  1.  Extended broad-spectrum beta-lactamase urinary tract infection.  2.  Congestive heart failure.  3.  Seizure disorder.  4.  Dementia.   PROCEDURES:  1. Ultrasound guidance for vascular access to right brachial vein.  2. Fluoroscopic guidance for placement of catheter.  3. Insertion of peripherally inserted central venous catheter, right arm.  SURGEON: Annice NeedyJason S. Dew, MD  ANESTHESIA: Local.   ESTIMATED BLOOD LOSS: Minimal.   INDICATION FOR PROCEDURE: A 81 year old white female with ESBL UTI who needs extended IV antibiotics.   DESCRIPTION OF PROCEDURE: The patient's right arm was sterilely prepped and draped, and a sterile surgical field was created. The right brachial vein was accessed under direct ultrasound guidance without difficulty with a micropuncture needle and permanent image was recorded. 0.018 wire was then placed into the superior vena cava. Peel-away sheath was placed over the wire. A single lumen peripherally inserted central venous catheter was then placed over the wire and the wire and peel-away sheath were removed. The catheter tip was placed into the superior vena cava and was secured at the skin at 34 cm with a sterile dressing. The catheter withdrew blood well and flushed easily with heparinized saline. The patient tolerated procedure well.   ____________________________ Annice NeedyJason S. Dew, MD jsd:np D: 08/24/2013 14:27:00 ET T: 08/24/2013 22:48:03 ET JOB#: [id number]  cc: Annice NeedyJason S. Dew, MD, <Dictator> Annice NeedyJASON S DEW MD ELECTRONICALLY SIGNED 08/26/2013 13:51
